# Patient Record
Sex: Female | Born: 1960 | Race: White | Hispanic: No | Marital: Single | Smoking: Never smoker
Health system: Southern US, Community
[De-identification: ages and names within clinical notes are randomized; demographics above are authoritative.]

## PROBLEM LIST (undated history)

## (undated) DIAGNOSIS — E119 Type 2 diabetes mellitus without complications: Principal | ICD-10-CM

## (undated) HISTORY — DX: Type 2 diabetes mellitus without complications: E11.9

## (undated) HISTORY — PX: NO PAST SURGERIES: SHX2092

---

## 2014-01-22 LAB — HM MAMMOGRAPHY: HM Mammogram: NORMAL (ref 0–4)

## 2014-01-22 LAB — HM PAP SMEAR

## 2016-08-27 ENCOUNTER — Telehealth: Payer: Self-pay | Admitting: Behavioral Health

## 2016-08-27 ENCOUNTER — Encounter: Payer: Self-pay | Admitting: Behavioral Health

## 2016-08-27 NOTE — Telephone Encounter (Signed)
Attempted to reach patient for Pre-Visit Call. Per recording, the patient is unavailable. Unable to leave a message at this time.

## 2016-08-27 NOTE — Telephone Encounter (Signed)
Pre-Visit Call completed with patient and chart updated.   Pre-Visit Info documented in Specialty Comments under Snap[employer].    

## 2016-08-29 ENCOUNTER — Ambulatory Visit (INDEPENDENT_AMBULATORY_CARE_PROVIDER_SITE_OTHER): Payer: Self-pay | Admitting: Family Medicine

## 2016-08-29 ENCOUNTER — Encounter: Payer: Self-pay | Admitting: Family Medicine

## 2016-08-29 VITALS — BP 133/79 | HR 95 | Temp 98.7°F | Ht 64.5 in | Wt 168.0 lb

## 2016-08-29 DIAGNOSIS — E119 Type 2 diabetes mellitus without complications: Secondary | ICD-10-CM | POA: Insufficient documentation

## 2016-08-29 DIAGNOSIS — Z0289 Encounter for other administrative examinations: Secondary | ICD-10-CM

## 2016-08-29 DIAGNOSIS — Z23 Encounter for immunization: Secondary | ICD-10-CM

## 2016-08-29 DIAGNOSIS — E669 Obesity, unspecified: Secondary | ICD-10-CM

## 2016-08-29 DIAGNOSIS — E1169 Type 2 diabetes mellitus with other specified complication: Secondary | ICD-10-CM | POA: Insufficient documentation

## 2016-08-29 HISTORY — DX: Type 2 diabetes mellitus without complications: E11.9

## 2016-08-29 MED ORDER — PRAVASTATIN SODIUM 40 MG PO TABS: 40.0000 mg | ORAL_TABLET | Freq: Every day | ORAL | 3 refills | Status: AC

## 2016-08-29 NOTE — Progress Notes (Signed)
Pre visit review using our clinic review tool, if applicable. No additional management support is needed unless otherwise documented below in the visit note. 

## 2016-08-29 NOTE — Progress Notes (Signed)
Chief Complaint  Patient presents with  . Establish Care    pt. reports no complaints today; she would like to have a medical certificate form completed       New Patient Visit SUBJECTIVE: HPI: Victoria Vazquez is an 56 y.o.female who is being seen for establishing care.  The patient was previously seen at office in [place]. 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 She has a hx of DM II. Her last eye exam was in 2016. She has never had a PCV23. She is diet controlled. She has lost quite a bit of weight and improved her diet since moving to [place]. She walks daily. She is not on a statin.   She does need a form filled out for her [employer] applications stating that she does not carry transmissible disease and is largely in good health.   Allergies  Allergen Reactions  . Erythromycin Rash    Past Medical History:  Diagnosis Date  . Diabetes mellitus type 2 in nonobese (HCC) 08/29/2016   Past Surgical History:  Procedure Laterality Date  . NO PAST SURGERIES     Social History   Social History  . Marital status: Single   Social History Main Topics  . Smoking status: Never Smoker  . Smokeless tobacco: Never Used  . Alcohol use Not on file  . Drug use: Unknown   Family History  Problem Relation Age of Onset  . Cancer Mother        colon cancer  . Cancer Paternal Grandmother        breast cancer  . Heart disease Paternal Grandmother   . Diabetes Neg Hx      Current Outpatient Prescriptions:  .  loratadine (CLARITIN) 10 MG tablet, Take 10 mg by mouth daily as needed for allergies., Disp: , Rfl:  .  ibuprofen (ADVIL,MOTRIN) 200 MG tablet, Take 200 mg by mouth every 6 (six) hours as needed., Disp: , Rfl:  .  pravastatin (PRAVACHOL) 40 MG tablet, Take 1 tablet (40 mg total) by mouth daily., Disp: 90 tablet, Rfl: 3  Patient's last menstrual period was 08/07/2016.  ROS Cardiovascular: Denies chest pain  Respiratory: Denies dyspnea    OBJECTIVE: BP 133/79 (BP Location: Left Arm, Cuff Size: Normal)   Pulse 95   Temp 98.7 F (37.1 C) (Oral)   Ht 5' 4.5" (1.638 m)   Wt 168 lb (76.2 kg)   LMP 08/07/2016   SpO2 98%   BMI 28.39 kg/m   Constitutional: -  VS reviewed -  Well developed, well nourished, appears stated age -  No apparent distress  Psychiatric: -  Oriented to person, place, and time -  Memory intact -  Affect and mood normal -  Fluent conversation, good eye contact -  Judgment and insight age appropriate  Eye: -  Conjunctivae clear, no discharge -  Pupils symmetric, round, reactive to light  ENMT: -  MMM    Pharynx moist, no exudate, no erythema  Neck: -  No gross swelling, no palpable masses -  Thyroid midline, not enlarged, mobile, no palpable masses  Cardiovascular: -  RRR -  No LE edema  Respiratory: -  Normal respiratory effort, no accessory muscle use, no retraction -  Breath sounds equal, no wheezes, no ronchi, no crackles  Musculoskeletal: -  No clubbing, no cyanosis -  Gait normal  Skin: -  No significant lesion on inspection -  Warm and dry to palpation  ASSESSMENT/PLAN: Diabetes mellitus type 2 in nonobese (HCC) - Plan: pravastatin (PRAVACHOL) 40 MG tablet, Hemoglobin A1c, Comprehensive metabolic panel, Lipid panel  Encounter for completion of form with patient  She is currently self pay, has coverage in [place]. Will cancel labs today and have her get them when she follows up in [place]. She will follow up with [facility] for her DM eye exam. PCV23 today.  Counseled on diet and exercise. She needs form on Letter Head- may need to print a letter stating the information. Patient should return if she needs us. The patient voiced understanding and agreement to the plan.   Jilda Rocheicholas Paul GarfieldWendling, DO 08/29/16  6:45 PM

## 2016-08-29 NOTE — Patient Instructions (Addendum)
If your labs are too expensive, wait until you are back in [place].  Let us know if you need anything.  If we cannot get the information on letterhead, I will print a letter with the required information.

## 2016-08-30 NOTE — Addendum Note (Signed)
Addended by: Verdie ShireBAYNES, ANGELA M on: 08/30/2016 09:05 AM   Modules accepted: Orders

## 2016-09-03 ENCOUNTER — Encounter: Payer: Self-pay | Admitting: Family Medicine

## 2016-09-03 NOTE — Telephone Encounter (Signed)
Please advise.//AB/CMA 

## 2016-09-06 ENCOUNTER — Encounter: Payer: Self-pay | Admitting: Family Medicine

## 2016-09-06 NOTE — Telephone Encounter (Signed)
Form and letter completed and signed.  Copy made for the pt and to be scanned into the chart.  Called and spoke with the pt and informed her that the letter and form is completed and ready to be picked up. Pt stated that she will pick up the letter and form on Friday.  Informed the pt that I will place the letter and form up front for her to pick up at her convenience.  Pt agreed.//AB/CMA

## 2018-07-16 ENCOUNTER — Other Ambulatory Visit: Payer: Self-pay

## 2018-07-16 ENCOUNTER — Ambulatory Visit: Payer: Self-pay | Admitting: Family Medicine

## 2018-07-16 ENCOUNTER — Encounter: Payer: Self-pay | Admitting: Family Medicine

## 2018-07-16 VITALS — BP 138/86 | HR 103 | Temp 98.7°F | Ht 64.0 in | Wt 174.0 lb

## 2018-07-16 DIAGNOSIS — Z1239 Encounter for other screening for malignant neoplasm of breast: Secondary | ICD-10-CM

## 2018-07-16 DIAGNOSIS — Z0289 Encounter for other administrative examinations: Secondary | ICD-10-CM

## 2018-07-16 NOTE — Patient Instructions (Signed)
If you are able to get me your results, please send them to me over [place].  Keep the diet clean and stay active.  Let us know if you need anything.

## 2018-07-16 NOTE — Progress Notes (Signed)
Subjective:   Chief Complaint  Patient presents with  . Annual Exam    Victoria Vazquez is a 58 y.o. female here for follow-up of diabetes.   Victoria Vazquez's self monitored glucose range is mid 100's.  Patient denies hypoglycemic reactions. She checks her glucose levels intermittently. Patient does not require insulin.   Diet is fair Medications include: diet controlled Exercise: walking  Going back to [place], needs form filled out.   Past Medical History:  Diagnosis Date  . Diabetes mellitus type 2 in nonobese (HCC) 08/29/2016     Related testing: Date of retinal exam: Due Pneumovax: done Flu Shot: N/A  Review of Systems: Pulmonary:  No SOB Cardiovascular:  No chest pain  Objective:  BP 138/86 (BP Location: Left Arm, Patient Position: Sitting, Cuff Size: Normal)   Pulse (!) 103   Temp 98.7 F (37.1 C) (Oral)   Ht 5\' 4"  (1.626 m)   Wt 174 lb (78.9 kg)   SpO2 98%   BMI 29.87 kg/m  General:  Well developed, well nourished, in no apparent distress Skin:  Warm, no pallor or diaphoresis Head:  Normocephalic, atraumatic Eyes:  Pupils equal and round, sclera anicteric without injection  Lungs:  CTAB, no access msc use Cardio:  RRR, no bruits, no LE edema Musculoskeletal:  Symmetrical muscle groups noted without atrophy or deformity Psych: Age appropriate judgment and insight  Assessment:   Encounter for completion of form with patient  Screening for breast cancer - Plan: MM DIGITAL SCREENING BILATERAL   Plan:   Filled out form for travel overseas. Again, she has coverage for medical in [place], will get labs and maintenance there. I asked her to send me results via MyChart if/when able.  The patient voiced understanding and agreement to the plan.  [place], DO 07/16/18 9:48 AM

## 2018-07-23 ENCOUNTER — Telehealth: Payer: Self-pay | Admitting: Family Medicine

## 2018-07-23 NOTE — Telephone Encounter (Signed)
Pt came in office stating needing to have document filled out again since pt has a new form that was required. (Document certified of Health form -2 pages) Pt would like to be called when document ready to pick up at [phone].(pt would like headletter on document- if any question to call pt) Document put at front office tray under providers name.

## 2018-07-29 NOTE — Telephone Encounter (Signed)
Called left message paperwork done and at the front desk for pickup. PCP completed. Copied and sent original to scan

## 2018-07-29 NOTE — Telephone Encounter (Signed)
Pt aware form ready and will pick up today

## 2018-09-30 ENCOUNTER — Encounter (HOSPITAL_BASED_OUTPATIENT_CLINIC_OR_DEPARTMENT_OTHER): Payer: Self-pay

## 2018-09-30 ENCOUNTER — Other Ambulatory Visit: Payer: Self-pay

## 2018-09-30 ENCOUNTER — Ambulatory Visit (HOSPITAL_BASED_OUTPATIENT_CLINIC_OR_DEPARTMENT_OTHER)
Admission: RE | Admit: 2018-09-30 | Discharge: 2018-09-30 | Disposition: A | Discharge status: Home / Self Care | Payer: Self-pay | Source: Ambulatory Visit | Attending: Family Medicine | Admitting: Family Medicine

## 2018-09-30 DIAGNOSIS — Z1239 Encounter for other screening for malignant neoplasm of breast: Secondary | ICD-10-CM

## 2018-09-30 DIAGNOSIS — Z1231 Encounter for screening mammogram for malignant neoplasm of breast: Secondary | ICD-10-CM | POA: Insufficient documentation

## 2021-04-16 IMAGING — MG MM DIGITAL SCREENING BILAT W/ TOMO W/ CAD
6 of 10 series · 6 of 30 positions shown · non-contrast
Comparison: None.

CLINICAL DATA: Screening.

EXAM:
DIGITAL SCREENING BILATERAL MAMMOGRAM WITH TOMO AND CAD

[R MLO synth-2D]
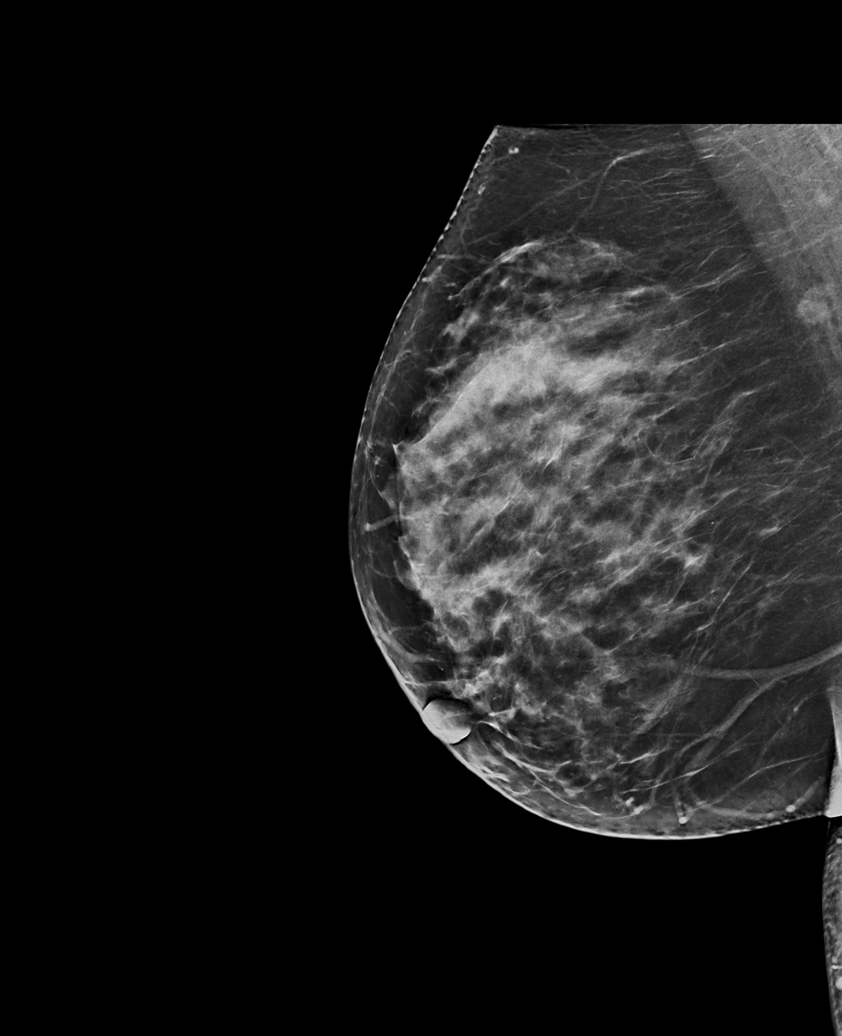

[R CC synth-2D]
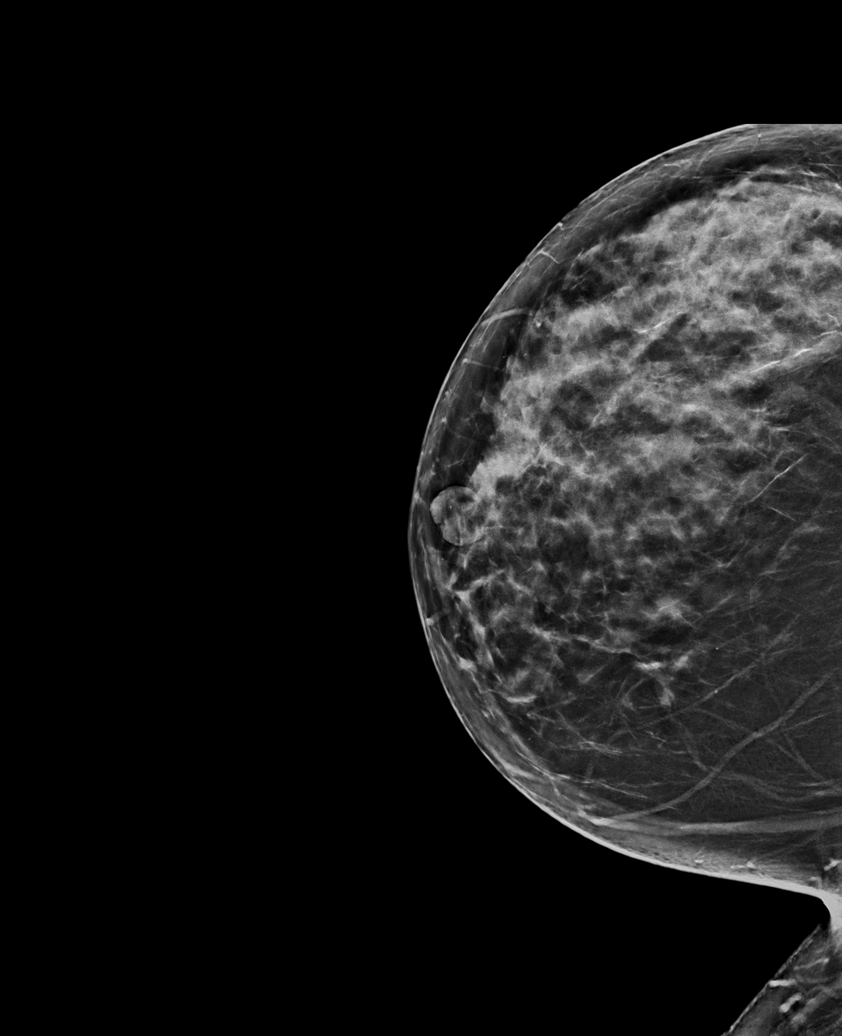

[L CC synth-2D]
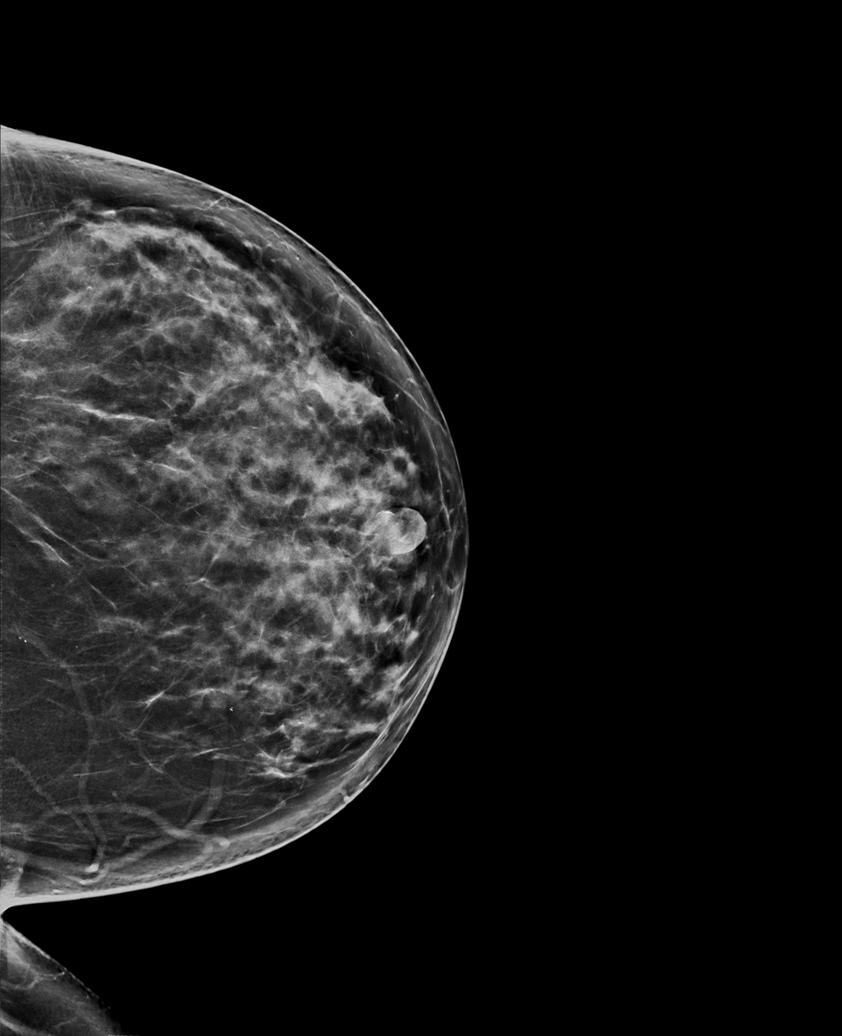

[R XCCL synth-2D]
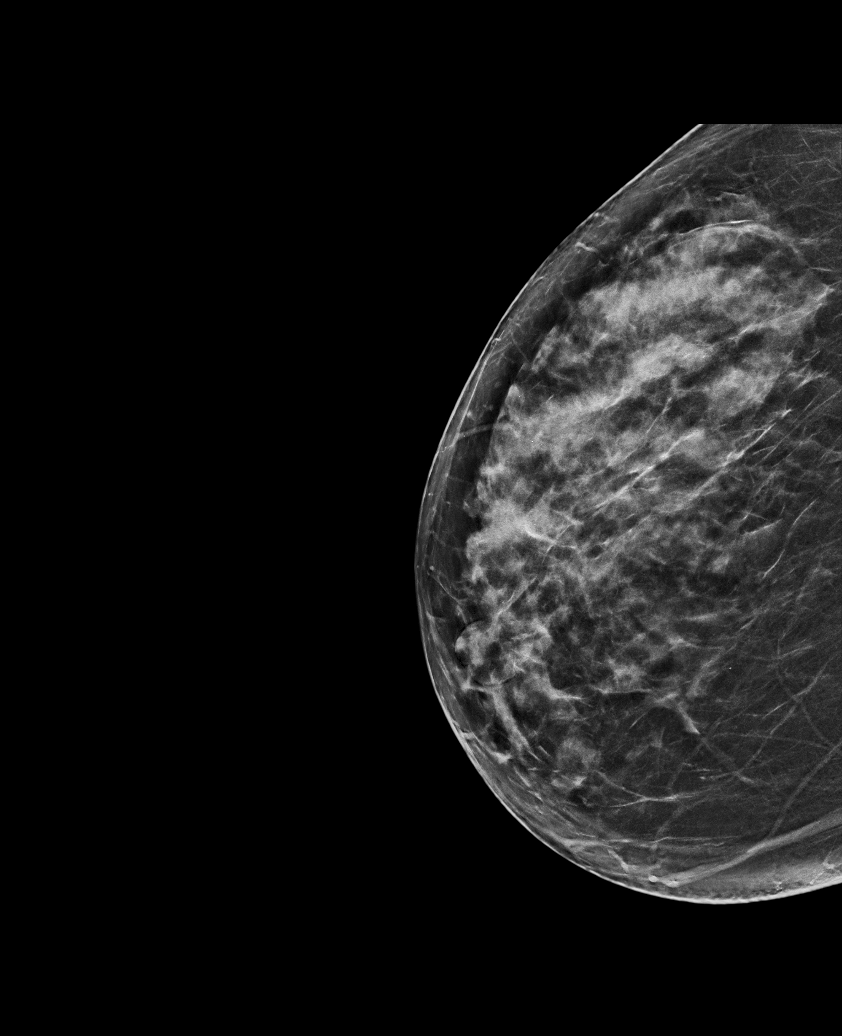

[L MLO synth-2D]
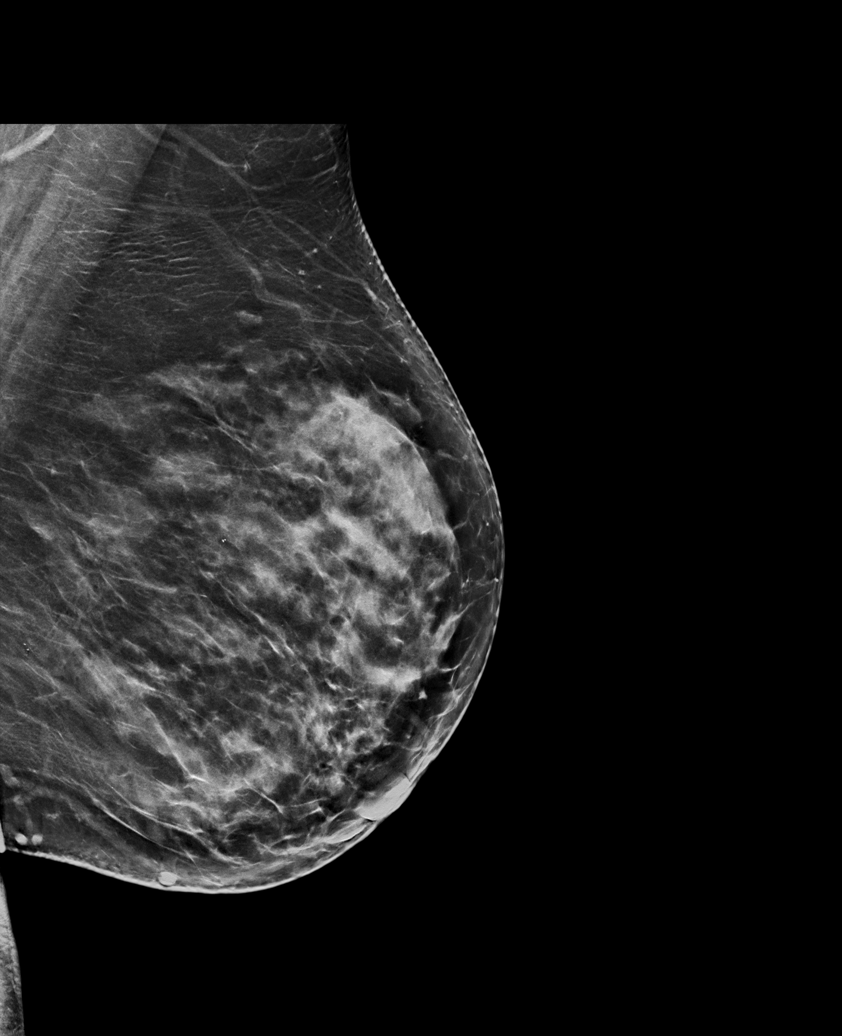

[L MLO tomo · tomo slice 41/82.0]
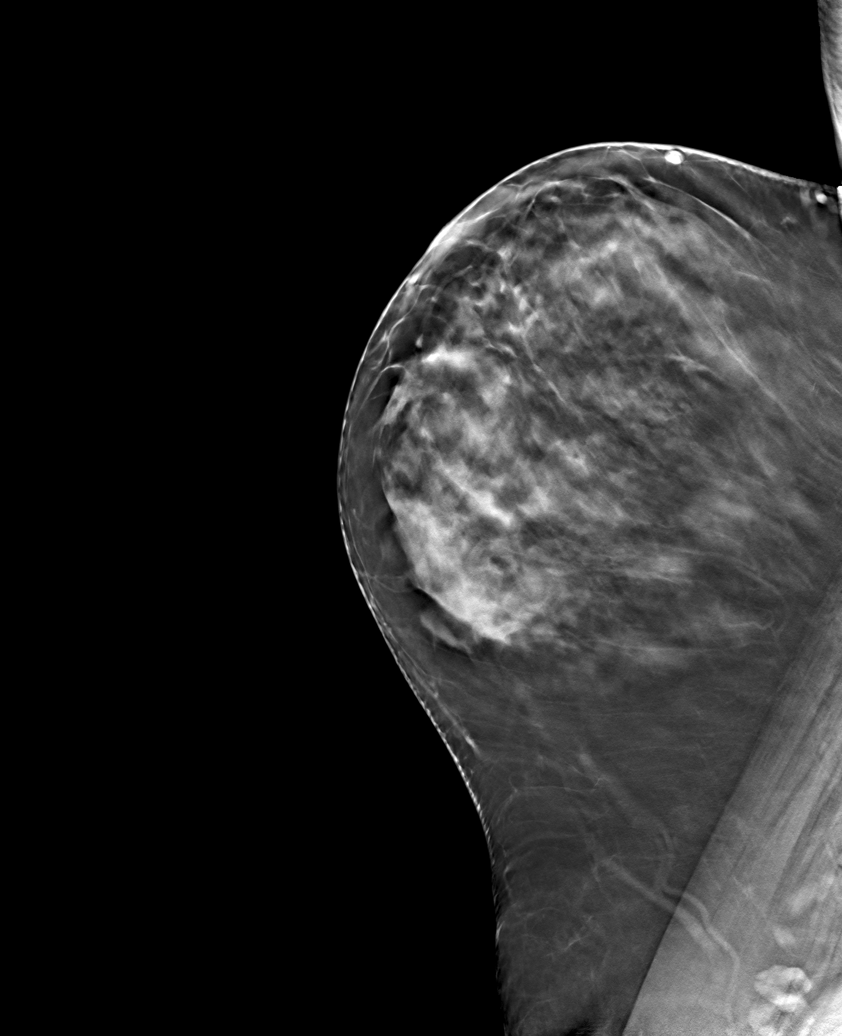

[6 of 30 positions shown; findings below may reference images not displayed]

ACR Breast Density Category c: The breast tissue is heterogeneously
dense, which may obscure small masses
FINDINGS: There are no findings suspicious for malignancy. Images were
processed with CAD.
IMPRESSION: No mammographic evidence of malignancy. A result letter of this
screening mammogram will be mailed directly to the patient.

RECOMMENDATION:
Screening mammogram in one year. (Code:EM-2-IHY)

BI-RADS CATEGORY  1: Negative.
